# Patient Record
Sex: Male | Born: 1975 | Race: White | Hispanic: No | Marital: Single | Smoking: Current every day smoker
Health system: Southern US, Community
[De-identification: ages and names within clinical notes are randomized; demographics above are authoritative.]

## PROBLEM LIST (undated history)

## (undated) DIAGNOSIS — F19988 Other psychoactive substance use, unspecified with other psychoactive substance-induced disorder: Secondary | ICD-10-CM

## (undated) DIAGNOSIS — F1010 Alcohol abuse, uncomplicated: Secondary | ICD-10-CM

## (undated) DIAGNOSIS — Z720 Tobacco use: Secondary | ICD-10-CM

## (undated) DIAGNOSIS — I10 Essential (primary) hypertension: Secondary | ICD-10-CM

## (undated) DIAGNOSIS — F1410 Cocaine abuse, uncomplicated: Secondary | ICD-10-CM

## (undated) DIAGNOSIS — F09 Unspecified mental disorder due to known physiological condition: Secondary | ICD-10-CM

## (undated) DIAGNOSIS — Z8659 Personal history of other mental and behavioral disorders: Secondary | ICD-10-CM

## (undated) DIAGNOSIS — J439 Emphysema, unspecified: Secondary | ICD-10-CM

## (undated) HISTORY — PX: TONSILLECTOMY: SUR1361

---

## 1999-09-04 ENCOUNTER — Inpatient Hospital Stay (HOSPITAL_COMMUNITY): Admission: AD | Admit: 1999-09-04 | Discharge: 1999-09-07 | Payer: Self-pay

## 2010-05-24 ENCOUNTER — Encounter: Payer: Self-pay | Admitting: Family Medicine

## 2011-12-19 ENCOUNTER — Emergency Department (HOSPITAL_COMMUNITY): Payer: PRIVATE HEALTH INSURANCE

## 2011-12-19 ENCOUNTER — Encounter (HOSPITAL_COMMUNITY): Payer: Self-pay | Admitting: *Deleted

## 2011-12-19 ENCOUNTER — Inpatient Hospital Stay (HOSPITAL_COMMUNITY): Payer: PRIVATE HEALTH INSURANCE

## 2011-12-19 ENCOUNTER — Inpatient Hospital Stay (HOSPITAL_COMMUNITY)
Admission: EM | Admit: 2011-12-19 | Discharge: 2011-12-20 | DRG: 918 | Payer: PRIVATE HEALTH INSURANCE | Attending: Internal Medicine | Admitting: Internal Medicine

## 2011-12-19 DIAGNOSIS — F19988 Other psychoactive substance use, unspecified with other psychoactive substance-induced disorder: Secondary | ICD-10-CM

## 2011-12-19 DIAGNOSIS — T510X1A Toxic effect of ethanol, accidental (unintentional), initial encounter: Secondary | ICD-10-CM | POA: Diagnosis present

## 2011-12-19 DIAGNOSIS — E872 Acidosis, unspecified: Secondary | ICD-10-CM | POA: Diagnosis present

## 2011-12-19 DIAGNOSIS — F29 Unspecified psychosis not due to a substance or known physiological condition: Secondary | ICD-10-CM

## 2011-12-19 DIAGNOSIS — Z720 Tobacco use: Secondary | ICD-10-CM | POA: Diagnosis present

## 2011-12-19 DIAGNOSIS — F09 Unspecified mental disorder due to known physiological condition: Secondary | ICD-10-CM

## 2011-12-19 DIAGNOSIS — F1010 Alcohol abuse, uncomplicated: Secondary | ICD-10-CM | POA: Diagnosis present

## 2011-12-19 DIAGNOSIS — T405X1A Poisoning by cocaine, accidental (unintentional), initial encounter: Principal | ICD-10-CM | POA: Diagnosis present

## 2011-12-19 DIAGNOSIS — I10 Essential (primary) hypertension: Secondary | ICD-10-CM | POA: Diagnosis present

## 2011-12-19 DIAGNOSIS — D72829 Elevated white blood cell count, unspecified: Secondary | ICD-10-CM | POA: Diagnosis present

## 2011-12-19 DIAGNOSIS — T43601A Poisoning by unspecified psychostimulants, accidental (unintentional), initial encounter: Secondary | ICD-10-CM | POA: Diagnosis present

## 2011-12-19 DIAGNOSIS — S0083XA Contusion of other part of head, initial encounter: Secondary | ICD-10-CM | POA: Diagnosis present

## 2011-12-19 DIAGNOSIS — Z9089 Acquired absence of other organs: Secondary | ICD-10-CM

## 2011-12-19 DIAGNOSIS — M542 Cervicalgia: Secondary | ICD-10-CM | POA: Diagnosis present

## 2011-12-19 DIAGNOSIS — F17200 Nicotine dependence, unspecified, uncomplicated: Secondary | ICD-10-CM | POA: Diagnosis present

## 2011-12-19 DIAGNOSIS — R7309 Other abnormal glucose: Secondary | ICD-10-CM | POA: Diagnosis present

## 2011-12-19 DIAGNOSIS — F1910 Other psychoactive substance abuse, uncomplicated: Secondary | ICD-10-CM

## 2011-12-19 DIAGNOSIS — E860 Dehydration: Secondary | ICD-10-CM | POA: Diagnosis present

## 2011-12-19 DIAGNOSIS — R000 Tachycardia, unspecified: Secondary | ICD-10-CM

## 2011-12-19 DIAGNOSIS — S0003XA Contusion of scalp, initial encounter: Secondary | ICD-10-CM | POA: Diagnosis present

## 2011-12-19 DIAGNOSIS — F1999 Other psychoactive substance use, unspecified with unspecified psychoactive substance-induced disorder: Secondary | ICD-10-CM

## 2011-12-19 DIAGNOSIS — R221 Localized swelling, mass and lump, neck: Secondary | ICD-10-CM

## 2011-12-19 DIAGNOSIS — R259 Unspecified abnormal involuntary movements: Secondary | ICD-10-CM | POA: Diagnosis present

## 2011-12-19 DIAGNOSIS — F1410 Cocaine abuse, uncomplicated: Secondary | ICD-10-CM | POA: Diagnosis present

## 2011-12-19 DIAGNOSIS — N390 Urinary tract infection, site not specified: Secondary | ICD-10-CM | POA: Diagnosis present

## 2011-12-19 DIAGNOSIS — I4510 Unspecified right bundle-branch block: Secondary | ICD-10-CM | POA: Diagnosis present

## 2011-12-19 DIAGNOSIS — M6282 Rhabdomyolysis: Secondary | ICD-10-CM | POA: Diagnosis present

## 2011-12-19 DIAGNOSIS — T50904A Poisoning by unspecified drugs, medicaments and biological substances, undetermined, initial encounter: Secondary | ICD-10-CM

## 2011-12-19 DIAGNOSIS — J438 Other emphysema: Secondary | ICD-10-CM | POA: Diagnosis present

## 2011-12-19 DIAGNOSIS — Z23 Encounter for immunization: Secondary | ICD-10-CM

## 2011-12-19 DIAGNOSIS — T510X4A Toxic effect of ethanol, undetermined, initial encounter: Secondary | ICD-10-CM | POA: Diagnosis present

## 2011-12-19 DIAGNOSIS — F19951 Other psychoactive substance use, unspecified with psychoactive substance-induced psychotic disorder with hallucinations: Secondary | ICD-10-CM | POA: Diagnosis present

## 2011-12-19 HISTORY — DX: Unspecified mental disorder due to known physiological condition: F09

## 2011-12-19 HISTORY — DX: Cocaine abuse, uncomplicated: F14.10

## 2011-12-19 HISTORY — DX: Essential (primary) hypertension: I10

## 2011-12-19 HISTORY — DX: Personal history of other mental and behavioral disorders: Z86.59

## 2011-12-19 HISTORY — DX: Other psychoactive substance use, unspecified with other psychoactive substance-induced disorder: F19.988

## 2011-12-19 HISTORY — DX: Tobacco use: Z72.0

## 2011-12-19 HISTORY — DX: Alcohol abuse, uncomplicated: F10.10

## 2011-12-19 HISTORY — DX: Emphysema, unspecified: J43.9

## 2011-12-19 LAB — T4, FREE: Free T4: 1.03 ng/dL (ref 0.80–1.80)

## 2011-12-19 LAB — BASIC METABOLIC PANEL
BUN: 24 mg/dL — ABNORMAL HIGH (ref 6–23)
BUN: 22 mg/dL (ref 6–23)
CO2: 22 mEq/L (ref 19–32)
Calcium: 8.6 mg/dL (ref 8.4–10.5)
Chloride: 105 mEq/L (ref 96–112)
Creatinine, Ser: 1.17 mg/dL (ref 0.50–1.35)
Creatinine, Ser: 1.1 mg/dL (ref 0.50–1.35)
GFR calc Af Amer: 90 mL/min — ABNORMAL LOW (ref >90)
GFR calc non Af Amer: 78 mL/min — ABNORMAL LOW (ref >90)
Glucose, Bld: 80 mg/dL (ref 70–99)
Potassium: 4.2 mEq/L (ref 3.5–5.1)

## 2011-12-19 LAB — HEPATIC FUNCTION PANEL
Albumin: 4.4 g/dL (ref 3.5–5.2)
Alkaline Phosphatase: 83 U/L (ref 39–117)
Bilirubin, Direct: 0.1 mg/dL (ref 0.0–0.3)
Total Bilirubin: 0.5 mg/dL (ref 0.3–1.2)

## 2011-12-19 LAB — CBC WITH DIFFERENTIAL/PLATELET
Eosinophils Absolute: 0.3 10*3/uL (ref 0.0–0.7)
Lymphs Abs: 1.8 10*3/uL (ref 0.7–4.0)
Neutrophils Relative %: 81 % — ABNORMAL HIGH (ref 43–77)
RBC: 5.15 MIL/uL (ref 4.22–5.81)
RDW: 12.7 % (ref 11.5–15.5)

## 2011-12-19 LAB — URINALYSIS, ROUTINE W REFLEX MICROSCOPIC
Glucose, UA: NEGATIVE mg/dL
Ketones, ur: 40 mg/dL — AB
Leukocytes, UA: NEGATIVE
Protein, ur: 30 mg/dL — AB
Urobilinogen, UA: 0.2 mg/dL (ref 0.0–1.0)

## 2011-12-19 LAB — CK: Total CK: 1497 U/L — ABNORMAL HIGH (ref 7–232)

## 2011-12-19 LAB — GLUCOSE, CAPILLARY

## 2011-12-19 LAB — RAPID URINE DRUG SCREEN, HOSP PERFORMED
Amphetamines: NOT DETECTED
Tetrahydrocannabinol: NOT DETECTED

## 2011-12-19 LAB — SALICYLATE LEVEL: Salicylate Lvl: <2 mg/dL — ABNORMAL LOW (ref 2.8–20.0)

## 2011-12-19 LAB — ACETAMINOPHEN LEVEL: Acetaminophen (Tylenol), Serum: <15 ug/mL (ref 10–30)

## 2011-12-19 LAB — HEMOGLOBIN A1C: Mean Plasma Glucose: 117 mg/dL — ABNORMAL HIGH (ref <117)

## 2011-12-19 MED ORDER — SODIUM CHLORIDE 0.9 % IV SOLN
1000.0000 mL | Freq: Once | INTRAVENOUS | Status: AC
  Administered 2011-12-19: 1000 mL via INTRAVENOUS

## 2011-12-19 MED ORDER — CLONIDINE HCL 0.1 MG PO TABS
0.1000 mg | ORAL_TABLET | Freq: Two times a day (BID) | ORAL | Status: DC
  Administered 2011-12-19: 0.1 mg via ORAL
  Filled 2011-12-19: qty 1

## 2011-12-19 MED ORDER — VITAMIN B-1 100 MG PO TABS
100.0000 mg | ORAL_TABLET | Freq: Every day | ORAL | Status: DC
  Administered 2011-12-19 – 2011-12-20 (×2): 100 mg via ORAL
  Filled 2011-12-19 (×2): qty 1

## 2011-12-19 MED ORDER — DEXTROSE 5 % IV SOLN
1.0000 g | Freq: Every day | INTRAVENOUS | Status: DC
  Administered 2011-12-19 – 2011-12-20 (×2): 1 g via INTRAVENOUS
  Filled 2011-12-19 (×2): qty 10

## 2011-12-19 MED ORDER — LORAZEPAM 2 MG/ML IJ SOLN
1.0000 mg | Freq: Four times a day (QID) | INTRAMUSCULAR | Status: DC | PRN
  Administered 2011-12-19 – 2011-12-20 (×2): 1 mg via INTRAVENOUS
  Filled 2011-12-19 (×2): qty 1

## 2011-12-19 MED ORDER — LORAZEPAM 1 MG PO TABS
1.0000 mg | ORAL_TABLET | Freq: Once | ORAL | Status: AC
  Administered 2011-12-19: 1 mg via ORAL
  Filled 2011-12-19: qty 1

## 2011-12-19 MED ORDER — LORAZEPAM 0.5 MG PO TABS: 1.0000 mg | ORAL_TABLET | Freq: Four times a day (QID) | ORAL | Status: DC | PRN

## 2011-12-19 MED ORDER — SODIUM CHLORIDE 0.9 % IV SOLN
1000.0000 mL | INTRAVENOUS | Status: DC
  Administered 2011-12-19 – 2011-12-20 (×2): 1000 mL via INTRAVENOUS

## 2011-12-19 MED ORDER — ADULT MULTIVITAMIN W/MINERALS CH
1.0000 | ORAL_TABLET | Freq: Every day | ORAL | Status: DC
  Administered 2011-12-19 – 2011-12-20 (×2): 1 via ORAL
  Filled 2011-12-19 (×2): qty 1

## 2011-12-19 MED ORDER — ALBUTEROL SULFATE (5 MG/ML) 0.5% IN NEBU: 2.5000 mg | INHALATION_SOLUTION | RESPIRATORY_TRACT | Status: DC | PRN

## 2011-12-19 MED ORDER — THIAMINE HCL 100 MG/ML IJ SOLN: 100.0000 mg | Freq: Every day | INTRAMUSCULAR | Status: DC

## 2011-12-19 MED ORDER — FOLIC ACID 1 MG PO TABS
1.0000 mg | ORAL_TABLET | Freq: Every day | ORAL | Status: DC
  Administered 2011-12-19 – 2011-12-20 (×2): 1 mg via ORAL
  Filled 2011-12-19 (×2): qty 1

## 2011-12-19 MED ORDER — NICOTINE 21 MG/24HR TD PT24
21.0000 mg | MEDICATED_PATCH | Freq: Every day | TRANSDERMAL | Status: DC
  Administered 2011-12-19: 21 mg via TRANSDERMAL
  Filled 2011-12-19 (×2): qty 1

## 2011-12-19 MED ORDER — SODIUM CHLORIDE 0.9 % IV SOLN
1000.0000 mL | INTRAVENOUS | Status: DC
  Administered 2011-12-19: 1000 mL via INTRAVENOUS

## 2011-12-19 MED ORDER — ONDANSETRON HCL 4 MG/2ML IJ SOLN: 4.0000 mg | Freq: Four times a day (QID) | INTRAMUSCULAR | Status: DC | PRN

## 2011-12-19 MED ORDER — ONDANSETRON HCL 4 MG PO TABS: 4.0000 mg | ORAL_TABLET | Freq: Four times a day (QID) | ORAL | Status: DC | PRN

## 2011-12-19 MED ORDER — ALUM & MAG HYDROXIDE-SIMETH 200-200-20 MG/5ML PO SUSP: 30.0000 mL | Freq: Four times a day (QID) | ORAL | Status: DC | PRN

## 2011-12-19 MED ORDER — ACETAMINOPHEN 325 MG PO TABS
650.0000 mg | ORAL_TABLET | Freq: Four times a day (QID) | ORAL | Status: DC | PRN
  Administered 2011-12-19 – 2011-12-20 (×2): 650 mg via ORAL
  Filled 2011-12-19 (×2): qty 2

## 2011-12-19 MED ORDER — ACETAMINOPHEN 650 MG RE SUPP: 650.0000 mg | Freq: Four times a day (QID) | RECTAL | Status: DC | PRN
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
## 2011-12-19 NOTE — ED Provider Notes (Signed)
See prior note   Ward Givens, MD 12/19/11 [phone]
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
## 2011-12-19 NOTE — Plan of Care (Signed)
Problem: Consults Goal: General Medical Patient Education See Patient Education Module for specific education. Outcome: Progressing Confused, Rhabdomyolosis suspected on admission. Monitoring closely Goal: Skin Care Protocol Initiated - if indicated If consults are not indicated, leave blank or document N/A Outcome: Progressing Bug bites over body. States he works outside  Problem: Phase I Progression Outcomes Goal: Pain controlled with appropriate interventions Outcome: Progressing Generalized pain, back of neck pain  Goal: OOB as tolerated unless otherwise ordered Outcome: Progressing Stood at bs standby assist only Goal: Initial discharge plan identified Outcome: Progressing Lives with girlfriend Morrie Sheldon of 10 years Goal: Voiding-avoid urinary catheter unless indicated Outcome: Completed/Met Date Met:  12/19/11 Patient voiding Goal: Hemodynamically stable Outcome: Completed/Met Date Met:  12/19/11 Vitals stable
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
## 2011-12-19 NOTE — ED Notes (Signed)
Report called to Anna, RN in ICU. 
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
## 2011-12-19 NOTE — ED Notes (Signed)
Patient transported to X-ray 

## 2011-12-20 ENCOUNTER — Encounter (HOSPITAL_COMMUNITY): Payer: Self-pay | Admitting: Internal Medicine

## 2011-12-20 DIAGNOSIS — F1010 Alcohol abuse, uncomplicated: Secondary | ICD-10-CM

## 2011-12-20 LAB — BASIC METABOLIC PANEL
CO2: 22 mEq/L (ref 19–32)
Calcium: 8.4 mg/dL (ref 8.4–10.5)
Chloride: 109 mEq/L (ref 96–112)
Potassium: 3.5 mEq/L (ref 3.5–5.1)
Sodium: 139 mEq/L (ref 135–145)

## 2011-12-20 LAB — CK TOTAL AND CKMB (NOT AT ARMC)
CK, MB: 13.4 ng/mL (ref 0.3–4.0)
Relative Index: 1 (ref 0.0–2.5)
Total CK: 1347 U/L — ABNORMAL HIGH (ref 7–232)

## 2011-12-20 LAB — CBC
HCT: 39.4 % (ref 39.0–52.0)
MCV: 88.3 fL (ref 78.0–100.0)
Platelets: 275 10*3/uL (ref 150–400)
RBC: 4.46 MIL/uL (ref 4.22–5.81)
WBC: 11.2 10*3/uL — ABNORMAL HIGH (ref 4.0–10.5)

## 2011-12-20 LAB — HEPATIC FUNCTION PANEL: Total Protein: 5.9 g/dL — ABNORMAL LOW (ref 6.0–8.3)

## 2011-12-20 MED ORDER — CLONIDINE HCL 0.1 MG PO TABS: 0.1000 mg | ORAL_TABLET | Freq: Two times a day (BID) | ORAL | Status: DC

## 2011-12-20 MED ORDER — CEFUROXIME AXETIL 500 MG PO TABS: 500.0000 mg | ORAL_TABLET | Freq: Two times a day (BID) | ORAL | Status: DC

## 2011-12-20 MED ORDER — POTASSIUM CHLORIDE CRYS ER 20 MEQ PO TBCR
20.0000 meq | EXTENDED_RELEASE_TABLET | Freq: Two times a day (BID) | ORAL | Status: DC
  Administered 2011-12-20: 20 meq via ORAL
  Filled 2011-12-20: qty 1

## 2011-12-20 MED ORDER — ADULT MULTIVITAMIN W/MINERALS CH: 1.0000 | ORAL_TABLET | Freq: Every day | ORAL | Status: DC

## 2011-12-20 MED ORDER — CLONIDINE HCL 0.1 MG PO TABS: 0.1000 mg | ORAL_TABLET | Freq: Every day | ORAL | Status: DC

## 2011-12-20 MED ORDER — CLONIDINE HCL 0.1 MG PO TABS
0.1000 mg | ORAL_TABLET | Freq: Every day | ORAL | Status: DC
  Filled 2011-12-20: qty 1

## 2011-12-20 MED ORDER — NICOTINE 21 MG/24HR TD PT24: 1.0000 | MEDICATED_PATCH | Freq: Every day | TRANSDERMAL | Status: AC

## 2011-12-20 MED ORDER — CEFUROXIME AXETIL 500 MG PO TABS: 500.0000 mg | ORAL_TABLET | Freq: Two times a day (BID) | ORAL | Status: AC
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
## 2011-12-20 NOTE — Clinical Social Work Note (Signed)
CSW received consult and chart reviewed. ACT team has assessed pt and recommended inpatient treatment. According to ACT note, pt has been referred to several facilities. ACT team to follow. CSW will sign off but can be reconsulted if needs arise.  Phillip Schultz, [place] [phone]

## 2011-12-20 NOTE — Progress Notes (Signed)
CRITICAL VALUE ALERT  Critical value received:  MB of 13.4  Date of notification:  12/20/11  Time of notification:  0615  Critical value read back: yes  Nurse who received alert:  Rockwell Germany RN  MD notified (1st page):  Dr. Vania Rea via text page  Time of first page:  [phone]  MD notified (2nd page):  Time of second page:  Responding MD:  Dr. Vania Rea  Time MD responded:  [phone]

## 2011-12-20 NOTE — BH Assessment (Signed)
Assessment Note   Phillip Schultz is an 36 y.o. male. After admission through the ED, the patient has been in the [facility].  His psychosis has resolved. The patient denies nay hallucinations this morning. He is not delusional. He feels the problems were from being overworked and exhausted. He also thinks there may have been something else in the Cocaine that he did. He denies any past history of hallucinations. He denies any SI. He has made only 1  Attempt on his life and that was 15 years ago. He does admit to an overdose, he states he was trying to get his wife to return to the marriage then. He denies depression. He is not interested in inpatient or out patient treatment. His girlfriend supports his statements and agrees that he does not need any treatment at this time.  Discussed with Dr Jimmy Footman I:  Cocaine Abuse; Alcohol Abuse; Substance induced psychotic disorder now resolved Axis II: Deferred Axis III:  Past Medical History  Diagnosis Date  . Hypertension   . History of depression     Attempted suicide as a teenager.  . Cocaine abuse   . Tobacco abuse   . Alcohol consumption binge drinking   . 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 Emphysema     Seen radiographically.   Axis IV: occupational problems Axis V: 51-60 moderate symptoms  Past Medical History:  Past Medical History  Diagnosis Date  . Hypertension   . History of depression     Attempted suicide as a teenager.  . Cocaine abuse   . Tobacco abuse   . Alcohol consumption binge drinking   . 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 Emphysema     Seen radiographically.    Past Surgical History  Procedure Date  . Tonsillectomy     Family History: No family history on file.  Social History:  reports that he has been smoking Cigarettes.  He does not have any smokeless tobacco history on file. He reports that he drinks alcohol. He reports that he uses illicit drugs  (Cocaine and Methamphetamines).  Additional Social History:  Alcohol / Drug Use History of alcohol / drug use?: Yes Longest period of sobriety (when/how long): 1 MONTH Substance #1 Name of Substance 1: ETOH 1 - Age of First Use: 11 1 - Amount (size/oz): 1 PINT 1 - Frequency:  2 X WEEKLY 1 - Duration: ON GOING 1 - Last Use / Amount: 12/18/11  CIWA: CIWA-Ar BP: 101/71 mmHg Pulse Rate: 106  Nausea and Vomiting: no nausea and no vomiting Tactile Disturbances: none Tremor: three Auditory Disturbances: not present Paroxysmal Sweats: no sweat visible Visual Disturbances: not present Anxiety: three Headache, Fullness in Head: none present Agitation: somewhat more than normal activity Orientation and Clouding of Sensorium: oriented and can do serial additions CIWA-Ar Total: 7  COWS:    Allergies: No Known Allergies  Home Medications:  No prescriptions prior to admission    OB/GYN Status:  No LMP for male patient.  General Assessment Data Location of Assessment: [facility] ([facility]) ACT Assessment: Yes Living Arrangements: Spouse/significant other Can pt return to current living arrangement?: Yes Admission Status: Voluntary Is patient capable of signing voluntary admission?: Yes Transfer from: Acute Hospital Referral Source: Medical Floor Inpatient  Education Status Is patient currently in school?: No  Risk to self Suicidal Ideation: No Suicidal Intent: No Is patient at risk for suicide?: No Suicidal Plan?: No Access to Means: No What  has been your use of drugs/alcohol within the last 12 months?: etoh;cocaine Previous Attempts/Gestures: Yes How many times?: 1  (overdose 15 years ago when wife left, attentions seeking to ) Other Self Harm Risks: self medicationing Triggers for Past Attempts: Other (Comment) (took overdose; attentions seeking; trying to getr wife to r;) Intentional Self Injurious Behavior: None Family Suicide History: Yes Recent stressful life event(s):  Other (Comment) (working long hours at work) Persecutory voices/beliefs?: No Depression: Yes Depression Symptoms: Fatigue;Loss of interest in usual pleasures (feling overworked;no time for him or family) Substance abuse history and/or treatment for substance abuse?: Yes Suicide prevention information given to non-admitted patients: Yes  Risk to Others Homicidal Ideation: No Thoughts of Harm to Others: No Current Homicidal Intent: No Current Homicidal Plan: No Access to Homicidal Means: No Identified Victim: na History of harm to others?: No Assessment of Violence: None Noted Violent Behavior Description: none Does patient have access to weapons?: No Criminal Charges Pending?: No Does patient have a court date: No  Psychosis Hallucinations: None noted Delusions: None noted  Mental Status Report Appear/Hygiene: Improved Eye Contact: Good Motor Activity: Restlessness Speech: Logical/coherent Level of Consciousness: Alert;Restless Mood: Anxious Affect: Anxious;Appropriate to circumstance Anxiety Level: Moderate Thought Processes: Coherent;Relevant Judgement: Unimpaired Orientation: Person;Place;Time;Situation Obsessive Compulsive Thoughts/Behaviors: None  Cognitive Functioning Concentration: Normal Memory: Recent Intact;Remote Intact IQ: Average Insight: Good Impulse Control: Fair Appetite: Good Weight Loss: 0  Weight Gain: 0  Sleep: Decreased Total Hours of Sleep: 12  Vegetative Symptoms: None  ADLScreening [facility] Assessment Services) Patient's cognitive ability adequate to safely complete daily activities?: Yes Patient able to express need for assistance with ADLs?: Yes Independently performs ADLs?: Yes (appropriate for developmental age)  Abuse/Neglect [facility]) Physical Abuse: Denies Verbal Abuse: Denies Sexual Abuse: Denies  Prior Inpatient Therapy Prior Inpatient Therapy: Yes Prior Therapy Dates: 44 YRS AGO Prior Therapy Facilty/Provider(s): [facility] Reason  for Treatment: STABILIZATION  Prior Outpatient Therapy Prior Outpatient Therapy: No Prior Therapy Dates: NA Prior Therapy Facilty/Provider(s): NA Reason for Treatment: NA  ADL Screening (condition at time of admission) Patient's cognitive ability adequate to safely complete daily activities?: Yes Patient able to express need for assistance with ADLs?: Yes Independently performs ADLs?: Yes (appropriate for developmental age) Weakness of Legs: None Weakness of Arms/Hands: None  Home Assistive Devices/Equipment Home Assistive Devices/Equipment: None  Therapy Consults (therapy consults require a physician order) PT Evaluation Needed: No OT Evalulation Needed: No SLP Evaluation Needed: No Abuse/Neglect Assessment (Assessment to be complete while patient is alone) Physical Abuse: Denies Verbal Abuse: Denies Sexual Abuse: Denies Exploitation of patient/patient's resources: Denies Self-Neglect: Denies Values / Beliefs Cultural Requests During Hospitalization: None Spiritual Requests During Hospitalization: None Consults Spiritual Care Consult Needed: No Social Work Consult Needed: No [employer]chant [employer] officer (For Healthcare) Advance Directive: Patient does not have advance directive Pre-existing out of facility DNR order (yellow form or pink MOST form): No Nutrition Screen Diet: Regular Unintentional weight loss greater than 10lbs within the last month: No Problems chewing or swallowing foods and/or liquids: No Home Tube Feeding or Total Parenteral Nutrition (TPN): No Patient appears severely malnourished: No  Additional Information 1:1 In Past 12 Months?: No CIRT Risk: No Elopement Risk: No Does patient have medical clearance?: Yes     Disposition:PATIENT DOES NOT MEET CRITERIA FOR INPATIENT CARE AT THIS TIME. PATIENT DECLINED REFERRAL TO OUT PATIENT SERVICES. PATIENT WILL FOLLOW UP WITH HIS PCP. PATIENT WILL ALSO DISCUSS HIS WORK ISSUES WITH HR AT HIS PLACE OF EMPLOYMENT. DR  [name] AGREES WITH THIS DISPOSITION  AND WILL DISCHARGE THE PATIENT.  Disposition Disposition of Patient: Other dispositions Type of inpatient treatment program: Adult Other disposition(s): Other (Comment) (to be seen by pcp; contact HR at work) Patient referred to: Other (Comment) (to see PCP; discuss work stress with HR)  On Site Evaluation by:   Reviewed with Physician:     Jearld Pies 12/20/2011 10:29 AM
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
## 2011-12-20 NOTE — Progress Notes (Signed)
Pt alert and oriented. Girlfriend, of 10 years, visiting in room. Pt, and family updated on plan of care. Tommy, from ACT team, notified by phone that pt has been medically cleared.

## 2011-12-20 NOTE — Progress Notes (Signed)
UR Chart Review Completed  
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
## 2011-12-20 NOTE — Progress Notes (Signed)
Pt discharged to home with girlfriend. D/c instructions given regarding follow up appointment, medications, activity, and diet.  IV d/c site clean, dry, and intact. Pt strongly discouraged from use of recreation drugs, and teaching of risks given. Pt nodded head in affirmation, but would not "teach back.'

## 2011-12-21 LAB — URINE CULTURE: Colony Count: NO GROWTH

## 2012-08-25 ENCOUNTER — Ambulatory Visit (INDEPENDENT_AMBULATORY_CARE_PROVIDER_SITE_OTHER): Payer: PRIVATE HEALTH INSURANCE | Admitting: Family Medicine

## 2012-08-25 ENCOUNTER — Encounter: Payer: Self-pay | Admitting: Family Medicine

## 2012-08-25 VITALS — BP 131/89 | HR 96 | Temp 97.5°F | Ht 71.5 in | Wt 230.8 lb

## 2012-08-25 DIAGNOSIS — Z720 Tobacco use: Secondary | ICD-10-CM | POA: Insufficient documentation

## 2012-08-25 DIAGNOSIS — E785 Hyperlipidemia, unspecified: Secondary | ICD-10-CM

## 2012-08-25 DIAGNOSIS — Z209 Contact with and (suspected) exposure to unspecified communicable disease: Secondary | ICD-10-CM

## 2012-08-25 DIAGNOSIS — A5903 Trichomonal cystitis and urethritis: Secondary | ICD-10-CM | POA: Insufficient documentation

## 2012-08-25 DIAGNOSIS — E8881 Metabolic syndrome: Secondary | ICD-10-CM

## 2012-08-25 DIAGNOSIS — F17200 Nicotine dependence, unspecified, uncomplicated: Secondary | ICD-10-CM

## 2012-08-25 DIAGNOSIS — I10 Essential (primary) hypertension: Secondary | ICD-10-CM

## 2012-08-25 LAB — HEPATIC FUNCTION PANEL
ALT: 31 U/L (ref 0–53)
AST: 21 U/L (ref 0–37)
Albumin: 4.4 g/dL (ref 3.5–5.2)
Alkaline Phosphatase: 75 U/L (ref 39–117)
Bilirubin, Direct: <0.1 mg/dL (ref 0.0–0.3)
Total Bilirubin: 0.4 mg/dL (ref 0.3–1.2)
Total Protein: 7.4 g/dL (ref 6.0–8.3)

## 2012-08-25 LAB — POCT URINALYSIS DIPSTICK
Bilirubin, UA: NEGATIVE
Glucose, UA: NEGATIVE
Ketones, UA: NEGATIVE
Leukocytes, UA: NEGATIVE
Nitrite, UA: NEGATIVE
Protein, UA: NEGATIVE
Spec Grav, UA: 1.02
Urobilinogen, UA: NEGATIVE
pH, UA: 5

## 2012-08-25 LAB — POCT UA - MICROSCOPIC ONLY
Casts, Ur, LPF, POC: NEGATIVE
Crystals, Ur, HPF, POC: NEGATIVE
Yeast, UA: NEGATIVE

## 2012-08-25 LAB — HEPATITIS C ANTIBODY: HCV Ab: NEGATIVE

## 2012-08-25 LAB — HEPATITIS B SURFACE ANTIGEN: Hepatitis B Surface Ag: NEGATIVE

## 2012-08-25 LAB — HIV ANTIBODY (ROUTINE TESTING W REFLEX): HIV: NONREACTIVE

## 2012-08-25 LAB — RPR

## 2012-08-25 MED ORDER — METRONIDAZOLE 500 MG PO TABS
2000.0000 mg | ORAL_TABLET | Freq: Once | ORAL | Status: DC
Start: 2012-08-25 — End: 2015-04-18

## 2012-08-25 NOTE — Progress Notes (Signed)
Patient ID: Phillip Schultz, male   DOB: Sep 14, 1975, 37 y.o.   MRN: [id number] SUBJECTIVE: HPI: Girlfriend of 6 months has trichomonas infection. He came to get checked for STDs.  PMH/PSH: reviewed/updated in Epic  SH/FH: reviewed/updated in Epic  Allergies: reviewed/updated in Epic  Medications: reviewed/updated in Epic  Immunizations: reviewed/updated in Epic  ROS: As above in the HPI. All other systems are stable or negative.  OBJECTIVE: APPEARANCE: WM NAD  Patient in no acute distress.The patient appeared well nourished and normally developed. Acyanotic. Waist: VITAL SIGNS:BP 131/89  Pulse 96  Temp(Src) 97.5 F (36.4 C) (Oral)  Ht 5' 11.5" (1.816 m)  Wt 230 lb 12.8 oz (104.69 kg)  BMI 31.74 kg/m2   SKIN: warm and  Dry without overt rashes, tattoos and scars  HEAD and Neck: without JVD, Head and scalp: normal Eyes:No scleral icterus. Fundi normal, eye movements normal. Ears: Auricle normal, canal normal, Tympanic membranes normal, insufflation normal. Nose: normal Throat: normal Neck & thyroid: normal  CHEST & LUNGS: Chest wall: normal Lungs: Clear  CVS: Reveals the PMI to be normally located. Regular rhythm, First and Second Heart sounds are normal,  absence of murmurs, rubs or gallops. Peripheral vasculature: Radial pulses: normal Dorsal pedis pulses: normal Posterior pulses: normal  ABDOMEN:  Appearance: normal Benign,, no organomegaly, no masses, no Abdominal Aortic enlargement. No Guarding , no rebound. No Bruits. Bowel sounds: normal   GU: normal  EXTREMETIES: nonedematous. Both Femoral and Pedal pulses are normal.  MUSCULOSKELETAL:  Spine: normal Joints: intact  ASSESSMENT: Urethritis, trichomonal - Plan: POCT UA - Microscopic Only, POCT urinalysis dipstick, Urine culture, GC/chlamydia probe amp, urine, HIV antibody, RPR, Hepatitis B surface antigen, Hepatitis C antibody, HSV(herpes simplex vrs) 1+2 ab-IgG, metroNIDAZOLE (FLAGYL) 500  MG tablet  Tobacco user  Metabolic syndrome - Plan: Hepatic function panel  HTN (hypertension) - Plan: BASIC METABOLIC PANEL WITH GFR  HLD (hyperlipidemia) - Plan: NMR Lipoprofile with Lipids, Hepatic function panel  Communicable disease contact - Plan: POCT UA - Microscopic Only, POCT urinalysis dipstick, Urine culture, GC/chlamydia probe amp, urine, HIV antibody, RPR, Hepatitis B surface antigen, Hepatitis C antibody, HSV(herpes simplex vrs) 1+2 ab-IgG  PLAN:      Dr Phillip Schultz Recommendations  Diet and Exercise discussed with patient.  For nutrition information, I recommend books:  1).Eat to Live by Dr Phillip Schultz. 2).Prevent and Reverse Heart Disease by Dr Phillip Schultz.  Exercise recommendations are:  If unable to walk, then the patient can exercise in a chair 3 times a day. By flapping arms like a bird gently and raising legs outwards to the front.  If ambulatory, the patient can go for walks for 30 minutes 3 times a week. Then increase the intensity and duration as tolerated.  Goal is to try to attain exercise frequency to 5 times a week.  If applicable: Best to perform resistance exercises (machines or weights) 2 days a week and cardio type exercises 3 days per week.  Orders Placed This Encounter  Procedures  . Urine culture  . GC/chlamydia probe amp, urine  . HIV antibody  . RPR  . Hepatitis B surface antigen  . Hepatitis C antibody  . HSV(herpes simplex vrs) 1+2 ab-IgG  . NMR Lipoprofile with Lipids  . BASIC METABOLIC PANEL WITH GFR  . Hepatic function panel  . POCT UA - Microscopic Only  . POCT urinalysis dipstick   Results for orders placed in visit on 08/25/12 (from the past 24 hour(s))  POCT URINALYSIS DIPSTICK  Status: None   Collection Time    08/25/12  2:23 PM      Result Value Range   Color, UA yellow     Clarity, UA clear     Glucose, UA neg     Bilirubin, UA neg     Ketones, UA neg     Spec Grav, UA 1.020     Blood, UA large      pH, UA 5.0     Protein, UA neg     Urobilinogen, UA negative     Nitrite, UA neg     Leukocytes, UA Negative    POCT UA - MICROSCOPIC ONLY     Status: None   Collection Time    08/25/12  2:46 PM      Result Value Range   WBC, Ur, HPF, POC 10-15     RBC, urine, microscopic 5-20     Bacteria, U Microscopic mod     Mucus, UA trace     Epithelial cells, urine per micros mod     Crystals, Ur, HPF, POC neg     Casts, Ur, LPF, POC neg     Yeast, UA neg     Meds ordered this encounter  Medications  . metroNIDAZOLE (FLAGYL) 500 MG tablet    Sig: Take 4 tablets (2,000 mg total) by mouth once.    Dispense:  4 tablet    Refill:  0  RTC pending labs. Hand outs on STDs given  Phillip Schultz P. Modesto Charon, M.D.

## 2012-08-25 NOTE — Patient Instructions (Addendum)
Smoking Cessation Quitting smoking is important to your health and has many advantages. However, it is not always easy to quit since nicotine is a very addictive drug. Often times, people try 3 times or more before being able to quit. This document explains the best ways for you to prepare to quit smoking. Quitting takes hard work and a lot of effort, but you can do it. ADVANTAGES OF QUITTING SMOKING  You will live longer, feel better, and live better.  Your body will feel the impact of quitting smoking almost immediately.  Within 20 minutes, blood pressure decreases. Your pulse returns to its normal level.  After 8 hours, carbon monoxide levels in the blood return to normal. Your oxygen level increases.  After 24 hours, the chance of having a heart attack starts to decrease. Your breath, hair, and body stop smelling like smoke.  After 48 hours, damaged nerve endings begin to recover. Your sense of taste and smell improve.  After 72 hours, the body is virtually free of nicotine. Your bronchial tubes relax and breathing becomes easier.  After 2 to 12 weeks, lungs can hold more air. Exercise becomes easier and circulation improves.  The risk of having a heart attack, stroke, cancer, or lung disease is greatly reduced.  After 1 year, the risk of coronary heart disease is cut in half.  After 5 years, the risk of stroke falls to the same as a nonsmoker.  After 10 years, the risk of lung cancer is cut in half and the risk of other cancers decreases significantly.  After 15 years, the risk of coronary heart disease drops, usually to the level of a nonsmoker.  If you are pregnant, quitting smoking will improve your chances of having a healthy baby.  The people you live with, especially any children, will be healthier.  You will have extra money to spend on things other than cigarettes. QUESTIONS TO THINK ABOUT BEFORE ATTEMPTING TO QUIT You may want to talk about your answers with your  caregiver.  Why do you want to quit?  If you tried to quit in the past, what helped and what did not?  What will be the most difficult situations for you after you quit? How will you plan to handle them?  Who can help you through the tough times? Your family? Friends? A caregiver?  What pleasures do you get from smoking? What ways can you still get pleasure if you quit? Here are some questions to ask your caregiver:  How can you help me to be successful at quitting?  What medicine do you think would be best for me and how should I take it?  What should I do if I need more help?  What is smoking withdrawal like? How can I get information on withdrawal? GET READY  Set a quit date.  Change your environment by getting rid of all cigarettes, ashtrays, matches, and lighters in your home, car, or work. Do not let people smoke in your home.  Review your past attempts to quit. Think about what worked and what did not. GET SUPPORT AND ENCOURAGEMENT You have a better chance of being successful if you have help. You can get support in many ways.  Tell your family, friends, and co-workers that you are going to quit and need their support. Ask them not to smoke around you.  Get individual, group, or telephone counseling and support. Programs are available at local hospitals and health centers. Call your local health department for   information about programs in your area.  Spiritual beliefs and practices may help some smokers quit.  Download a "quit meter" on your computer to keep track of quit statistics, such as how long you have gone without smoking, cigarettes not smoked, and money saved.  Get a self-help book about quitting smoking and staying off of tobacco. LEARN NEW SKILLS AND BEHAVIORS  Distract yourself from urges to smoke. Talk to someone, go for a walk, or occupy your time with a task.  Change your normal routine. Take a different route to work. Drink tea instead of coffee.  Eat breakfast in a different place.  Reduce your stress. Take a hot bath, exercise, or read a book.  Plan something enjoyable to do every day. Reward yourself for not smoking.  Explore interactive web-based programs that specialize in helping you quit. GET MEDICINE AND USE IT CORRECTLY Medicines can help you stop smoking and decrease the urge to smoke. Combining medicine with the above behavioral methods and support can greatly increase your chances of successfully quitting smoking.  Nicotine replacement therapy helps deliver nicotine to your body without the negative effects and risks of smoking. Nicotine replacement therapy includes nicotine gum, lozenges, inhalers, nasal sprays, and skin patches. Some may be available over-the-counter and others require a prescription.  Antidepressant medicine helps people abstain from smoking, but how this works is unknown. This medicine is available by prescription.  Nicotinic receptor partial agonist medicine simulates the effect of nicotine in your brain. This medicine is available by prescription. Ask your caregiver for advice about which medicines to use and how to use them based on your health history. Your caregiver will tell you what side effects to look out for if you choose to be on a medicine or therapy. Carefully read the information on the package. Do not use any other product containing nicotine while using a nicotine replacement product.  RELAPSE OR DIFFICULT SITUATIONS Most relapses occur within the first 3 months after quitting. Do not be discouraged if you start smoking again. Remember, most people try several times before finally quitting. You may have symptoms of withdrawal because your body is used to nicotine. You may crave cigarettes, be irritable, feel very hungry, cough often, get headaches, or have difficulty concentrating. The withdrawal symptoms are only temporary. They are strongest when you first quit, but they will go away within  10 14 days. To reduce the chances of relapse, try to:  Avoid drinking alcohol. Drinking lowers your chances of successfully quitting.  Reduce the amount of caffeine you consume. Once you quit smoking, the amount of caffeine in your body increases and can give you symptoms, such as a rapid heartbeat, sweating, and anxiety.  Avoid smokers because they can make you want to smoke.  Do not let weight gain distract you. Many smokers will gain weight when they quit, usually less than 10 pounds. Eat a healthy diet and stay active. You can always lose the weight gained after you quit.  Find ways to improve your mood other than smoking. FOR MORE INFORMATION  www.smokefree.gov  Document Released: 04/13/2001 Document Revised: 10/19/2011 Document Reviewed: 07/29/2011 [facility] Patient Information 2013 [place], [place].        Dr Woodroe Mode Recommendations  Diet and Exercise discussed with patient.  For nutrition information, I recommend books:  1).Eat to Live by Dr Monico Hoar. 2).Prevent and Reverse Heart Disease by Dr Suzzette Righter.  Exercise recommendations are:  If unable to walk, then the patient can exercise in a  chair 3 times a day. By flapping arms like a bird gently and raising legs outwards to the front.  If ambulatory, the patient can go for walks for 30 minutes 3 times a week. Then increase the intensity and duration as tolerated.  Goal is to try to attain exercise frequency to 5 times a week.  If applicable: Best to perform resistance exercises (machines or weights) 2 days a week and cardio type exercises 3 days per week.   Trichomoniasis Trichomoniasis is an infection, caused by the Trichomonas organism, that affects both women and men. In women, the outer male genitalia and the vagina are affected. In men, the penis is mainly affected, but the prostate and other reproductive organs can also be involved. Trichomoniasis is a sexually transmitted disease (STD) and is  most often passed to another person through sexual contact. The majority of people who get trichomoniasis do so from a sexual encounter and are also at risk for other STDs. CAUSES   Sexual intercourse with an infected partner.  It can be present in swimming pools or hot tubs. SYMPTOMS   Abnormal gray-green frothy vaginal discharge in women.  Vaginal itching and irritation in women.  Itching and irritation of the area outside the vagina in women.  Penile discharge with or without pain in males.  Inflammation of the urethra (urethritis), causing painful urination.  Bleeding after sexual intercourse. RELATED COMPLICATIONS  Pelvic inflammatory disease.  Infection of the uterus (endometritis).  Infertility.  Tubal (ectopic) pregnancy.  It can be associated with other STDs, including gonorrhea and chlamydia, hepatitis B, and HIV. COMPLICATIONS DURING PREGNANCY  Early (premature) delivery.  Premature rupture of the membranes (PROM).  Low birth weight. DIAGNOSIS   Visualization of Trichomonas under the microscope from the vagina discharge.  Ph of the vagina greater than 4.5, tested with a test tape.  Trich Rapid Test.  Culture of the organism, but this is not usually needed.  It may be found on a Pap test.  Having a "strawberry cervix,"which means the cervix looks very red like a strawberry. TREATMENT   You may be given medication to fight the infection. Inform your caregiver if you could be or are pregnant. Some medications used to treat the infection should not be taken during pregnancy.  Over-the-counter medications or creams to decrease itching or irritation may be recommended.  Your sexual partner will need to be treated if infected. HOME CARE INSTRUCTIONS   Take all medication prescribed by your caregiver.  Take over-the-counter medication for itching or irritation as directed by your caregiver.  Do not have sexual intercourse while you have the  infection.  Do not douche or wear tampons.  Discuss your infection with your partner, as your partner may have acquired the infection from you. Or, your partner may have been the person who transmitted the infection to you.  Have your sex partner examined and treated if necessary.  Practice safe, informed, and protected sex.  See your caregiver for other STD testing. SEEK MEDICAL CARE IF:   You still have symptoms after you finish the medication.  You have an oral temperature above 102 F (38.9 C).  You develop belly (abdominal) pain.  You have pain when you urinate.  You have bleeding after sexual intercourse.  You develop a rash.  The medication makes you sick or makes you throw up (vomit). Document Released: 10/13/2000 Document Revised: 07/12/2011 Document Reviewed: 11/08/2008 [facility] Patient Information 2013 [place], [place].   Sexually Transmitted Disease Sexually transmitted disease (  STD) refers to any infection that is passed from person to person during sexual activity. This may happen by way of saliva, semen, blood, vaginal mucus, or urine. Common STDs include:  Gonorrhea.  Chlamydia.  Syphilis.  HIV/AIDS.  Genital herpes.  Hepatitis B and C.  Trichomonas.  Human papillomavirus (HPV).  Pubic lice. CAUSES  An STD may be spread by bacteria, virus, or parasite. A person can get an STD by:  Sexual intercourse with an infected person.  Sharing sex toys with an infected person.  Sharing needles with an infected person.  Having intimate contact with the genitals, mouth, or rectal areas of an infected person. SYMPTOMS  Some people may not have any symptoms, but they can still pass the infection to others. Different STDs have different symptoms. Symptoms include:  Painful or bloody urination.  Pain in the pelvis, abdomen, vagina, anus, throat, or eyes.  Skin rash, itching, irritation, growths, or sores (lesions). These usually occur in the genital  or anal area.  Abnormal vaginal discharge.  Penile discharge in men.  Soft, flesh-colored skin growths in the genital or anal area.  Fever.  Pain or bleeding during sexual intercourse.  Swollen glands in the groin area.  Yellow skin and eyes (jaundice). This is seen with hepatitis. DIAGNOSIS  To make a diagnosis, your caregiver may:  Take a medical history.  Perform a physical exam.  Take a specimen (culture) to be examined.  Examine a sample of discharge under a microscope.  Perform blood tests.  Perform a Pap test, if this applies.  Perform a colposcopy.  Perform a laparoscopy. TREATMENT   Chlamydia, gonorrhea, trichomonas, and syphilis can be cured with antibiotic medicine.  Genital herpes, hepatitis, and HIV can be treated, but not cured, with prescribed medicines. The medicines will lessen the symptoms.  Genital warts from HPV can be treated with medicine or by freezing, burning (electrocautery), or surgery. Warts may come back.  HPV is a virus and cannot be cured with medicine or surgery.However, abnormal areas may be followed very closely by your caregiver and may be removed from the cervix, vagina, or vulva through office procedures or surgery. If your diagnosis is confirmed, your recent sexual partners need treatment. This is true even if they are symptom-free or have a negative culture or evaluation. They should not have sex until their caregiver says it is okay. HOME CARE INSTRUCTIONS  All sexual partners should be informed, tested, and treated for all STDs.  Take your antibiotics as directed. Finish them even if you start to feel better.  Only take over-the-counter or prescription medicines for pain, discomfort, or fever as directed by your caregiver.  Rest.  Eat a balanced diet and drink enough fluids to keep your urine clear or pale yellow.  Do not have sex until treatment is completed and you have followed up with your caregiver. STDs should be  checked after treatment.  Keep all follow-up appointments, Pap tests, and blood tests as directed by your caregiver.  Only use latex condoms and water-soluble lubricants during sexual activity. Do not use petroleum jelly or oils.  Avoid alcohol and illegal drugs.  Get vaccinated for HPV and hepatitis. If you have not received these vaccines in the past, talk to your caregiver about whether one or both might be right for you.  Avoid risky sex practices that can break the skin. The only way to avoid getting an STD is to avoid all sexual activity.Latex condoms and dental dams (for oral sex) will  help lessen the risk of getting an STD, but will not completely eliminate the risk. SEEK MEDICAL CARE IF:   You have a fever.  You have any new or worsening symptoms. Document Released: 07/10/2002 Document Revised: 07/12/2011 Document Reviewed: 07/17/2010 [facility] Patient Information 2013 [place], [place], [place].

## 2012-08-26 LAB — BASIC METABOLIC PANEL WITH GFR
BUN: 18 mg/dL (ref 6–23)
CO2: 24 mEq/L (ref 19–32)
Calcium: 9.5 mg/dL (ref 8.4–10.5)
Chloride: 102 mEq/L (ref 96–112)
Creat: 0.87 mg/dL (ref 0.50–1.35)
GFR, Est African American: >89 mL/min
GFR, Est Non African American: >89 mL/min
Glucose, Bld: 99 mg/dL (ref 70–99)
Potassium: 3.8 mEq/L (ref 3.5–5.3)
Sodium: 136 mEq/L (ref 135–145)

## 2012-08-26 LAB — URINE CULTURE
Colony Count: NO GROWTH
Organism ID, Bacteria: NO GROWTH

## 2012-08-28 LAB — NMR LIPOPROFILE WITH LIPIDS
Cholesterol, Total: 214 mg/dL — ABNORMAL HIGH (ref <200)
HDL Particle Number: 25.1 umol/L — ABNORMAL LOW (ref >=30.5)
HDL Size: 8.9 nm — ABNORMAL LOW (ref >=9.2)
HDL-C: 32 mg/dL — ABNORMAL LOW (ref >=40)
LDL (calc): 48 mg/dL (ref <100)
LDL Particle Number: 2042 nmol/L — ABNORMAL HIGH (ref <1000)
LDL Size: 19.5 nm — ABNORMAL LOW (ref >20.5)
LP-IR Score: 80 — ABNORMAL HIGH (ref <=45)
Large HDL-P: <1.3 umol/L — ABNORMAL LOW (ref >=4.8)
Large VLDL-P: 31.2 nmol/L — ABNORMAL HIGH (ref <=2.7)
Small LDL Particle Number: 1819 nmol/L — ABNORMAL HIGH (ref <=527)
Triglycerides: 668 mg/dL — ABNORMAL HIGH (ref <150)
VLDL Size: 57.3 nm — ABNORMAL HIGH (ref <=46.6)

## 2012-08-28 LAB — HSV(HERPES SIMPLEX VRS) I + II AB-IGG
HSV 1 Glycoprotein G Ab, IgG: 4.69 IV — ABNORMAL HIGH
HSV 2 Glycoprotein G Ab, IgG: <0.1 IV

## 2012-08-30 LAB — GC/CHLAMYDIA PROBE AMP, URINE
Chlamydia, Swab/Urine, PCR: NEGATIVE
GC Probe Amp, Urine: NEGATIVE

## 2012-09-04 ENCOUNTER — Encounter: Payer: Self-pay | Admitting: Nurse Practitioner

## 2012-09-04 NOTE — Progress Notes (Signed)
This encounter was created in error - please disregard.

## 2012-10-14 ENCOUNTER — Ambulatory Visit (INDEPENDENT_AMBULATORY_CARE_PROVIDER_SITE_OTHER): Payer: Self-pay | Admitting: General Practice

## 2012-10-14 VITALS — BP 145/95 | HR 100 | Temp 98.1°F | Ht 72.0 in | Wt 232.0 lb

## 2012-10-14 DIAGNOSIS — J069 Acute upper respiratory infection, unspecified: Secondary | ICD-10-CM

## 2012-10-14 MED ORDER — CLONIDINE HCL 0.1 MG PO TABS: 0.1000 mg | ORAL_TABLET | Freq: Every day | ORAL | Status: AC

## 2012-10-14 MED ORDER — CLONIDINE HCL 0.1 MG PO TABS: 0.1000 mg | ORAL_TABLET | Freq: Every day | ORAL | Status: DC

## 2012-10-14 MED ORDER — AMOXICILLIN 500 MG PO CAPS: 500.0000 mg | ORAL_CAPSULE | Freq: Two times a day (BID) | ORAL | Status: AC

## 2012-10-14 NOTE — Patient Instructions (Signed)

## 2012-10-14 NOTE — Progress Notes (Signed)
  Subjective:    Patient ID: Phillip Schultz, male    DOB: Feb 06, 1976, 37 y.o.   MRN: [id number]  HPI Presents today with cough, sore throat, and sinus pressure, with onset three days ago. Denies taking OTC medications.     Review of Systems  Constitutional: Positive for chills. Negative for fever.  HENT: Positive for congestion, sore throat, sneezing, postnasal drip and sinus pressure. Negative for neck pain and neck stiffness.   Respiratory: Positive for cough. Negative for chest tightness, shortness of breath and wheezing.   Cardiovascular: Negative for chest pain and palpitations.       Objective:   Physical Exam  Constitutional: He is oriented to person, place, and time. He appears well-developed and well-nourished.  HENT:  Head: Normocephalic and atraumatic.  Right Ear: External ear normal.  Left Ear: External ear normal.  Nose: Mucosal edema and rhinorrhea present.  Mouth/Throat: Posterior oropharyngeal erythema present.  Glossy eyes and watering  Cardiovascular: Normal rate, regular rhythm and normal heart sounds.   Pulmonary/Chest: Effort normal and breath sounds normal. No respiratory distress. He exhibits no tenderness.  Neurological: He is alert and oriented to person, place, and time.  Skin: Skin is warm and dry.  Psychiatric: He has a normal mood and affect.          Assessment & Plan:  1. Acute upper respiratory infections of unspecified site - amoxicillin (AMOXIL) 500 MG capsule; Take 1 capsule (500 mg total) by mouth 2 (two) times daily.  Dispense: 20 capsule; Refill: 0 -Increase fluid intake Motrin or tylenol OTC OTC decongestant Throat lozenges if help New toothbrush in 3 days Proper handwashing RTO if symptoms worsen or unresoleved Patient verbalized understanding Coralie Keens, FNP-C

## 2013-01-12 IMAGING — CT CT CERVICAL SPINE W/O CM
4 series · 15 of 33 positions shown, 18 images · non-contrast
Comparison: None.

CLINICAL DATA: Neck pain.  No known injury.

CT CERVICAL SPINE WITHOUT CONTRAST
TECHNIQUE: Multidetector CT imaging of the cervical spine was
performed. Multiplanar CT image reconstructions were also
generated.

[Series 3: cervical 2.0 st axial · axial · 0.32mm/px · z∈[+85,+213]mm · 5 of 97 slices shown, 7 images]
[im 17/97  soft-tissue]
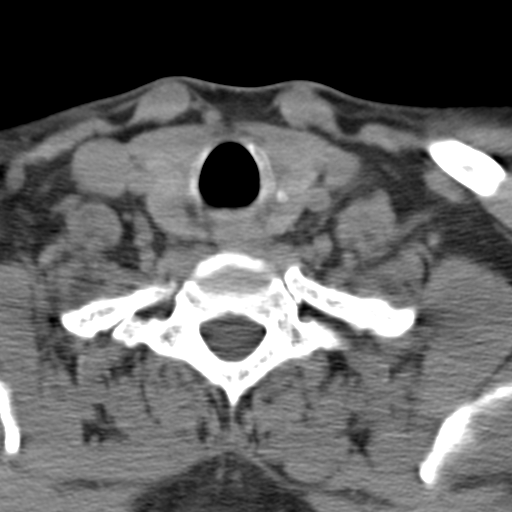
[im 17/97  bone]
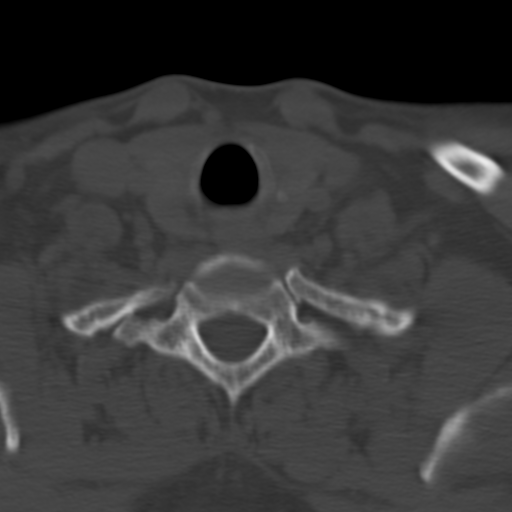
[im 33/97  bone]
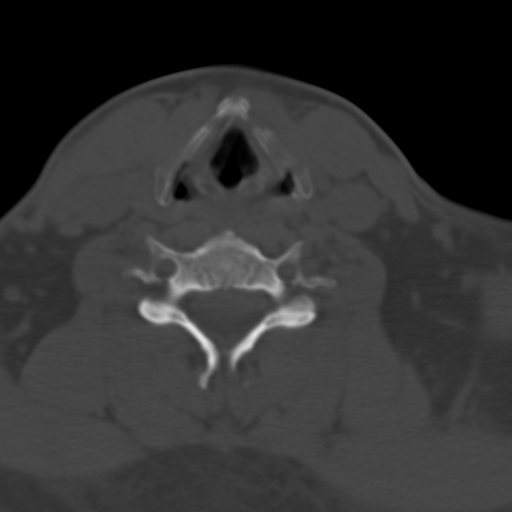
[im 49/97  bone]
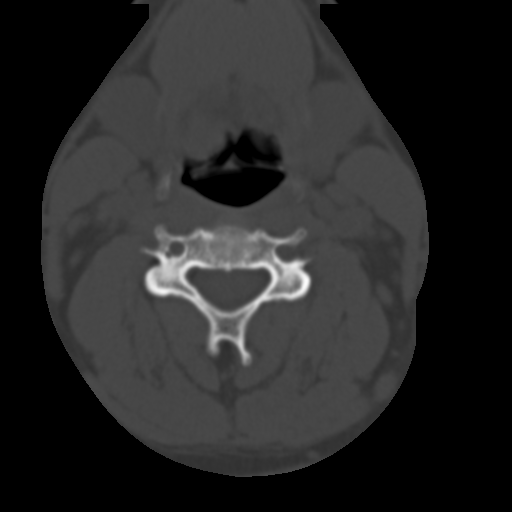
[im 65/97  bone]
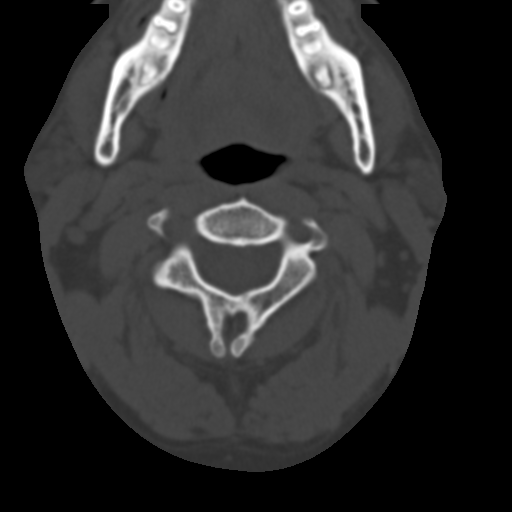
[im 81/97  soft-tissue]
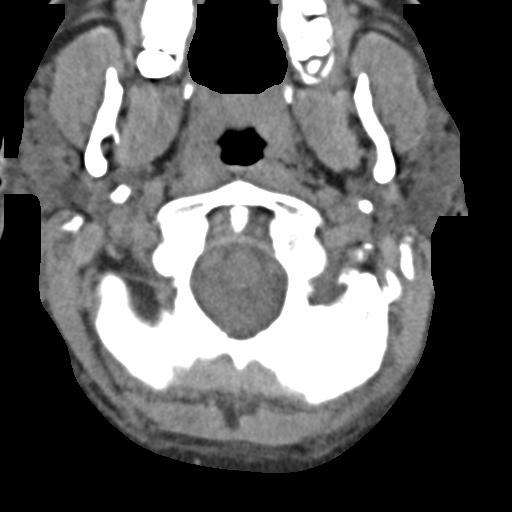
[im 81/97  bone]
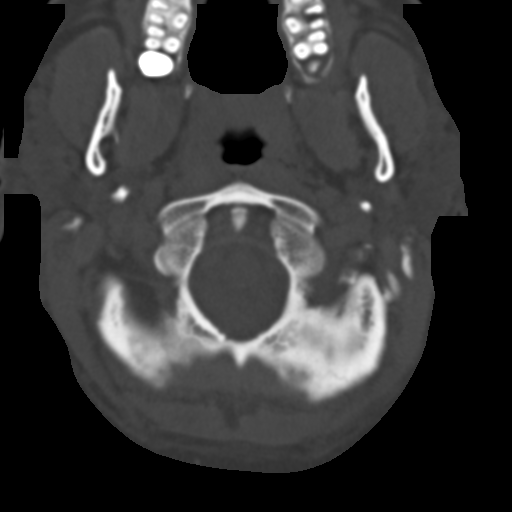

[Series 5: cervical spine sagittal bone · sagittal · 0.33mm/px · 5 of 51 slices shown, 6 images]
[im 17/51  bone]
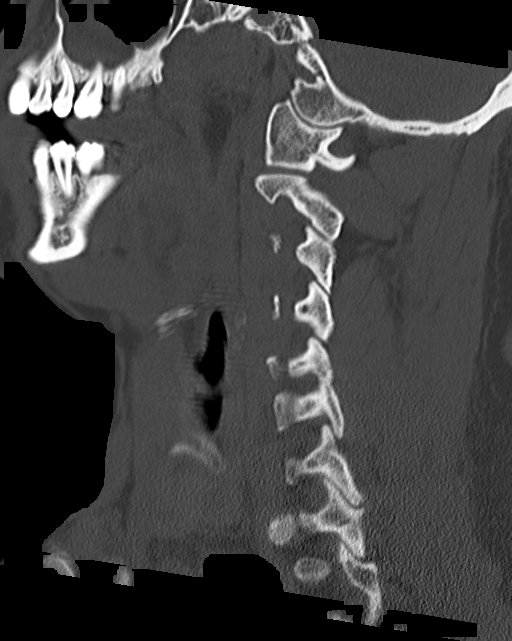
[im 21/51  bone]
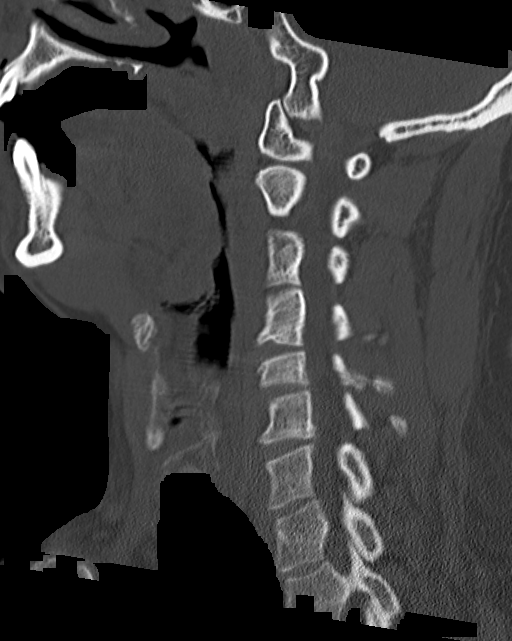
[im 26/51  soft-tissue]
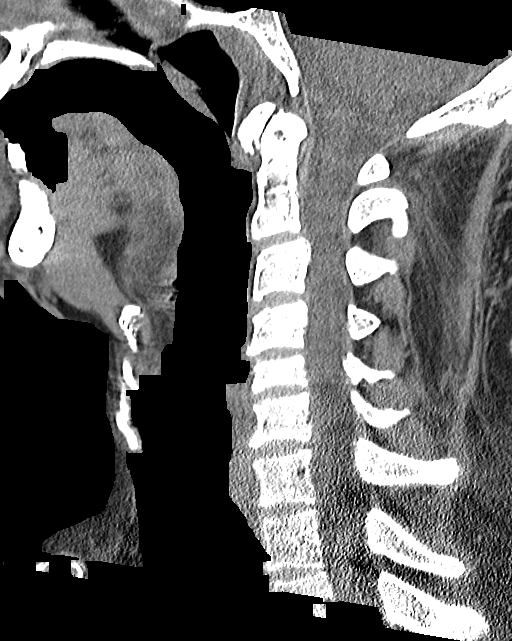
[im 26/51  bone]
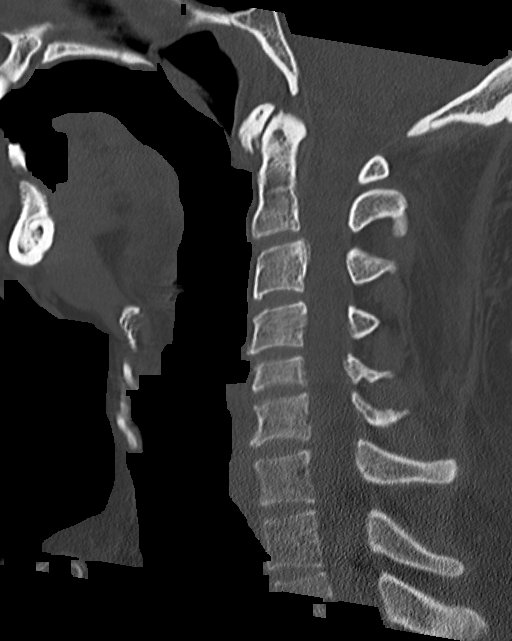
[im 30/51  bone]
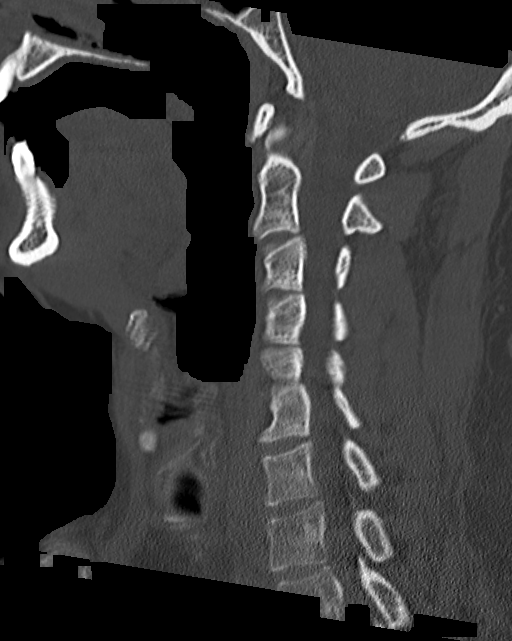
[im 34/51  bone]
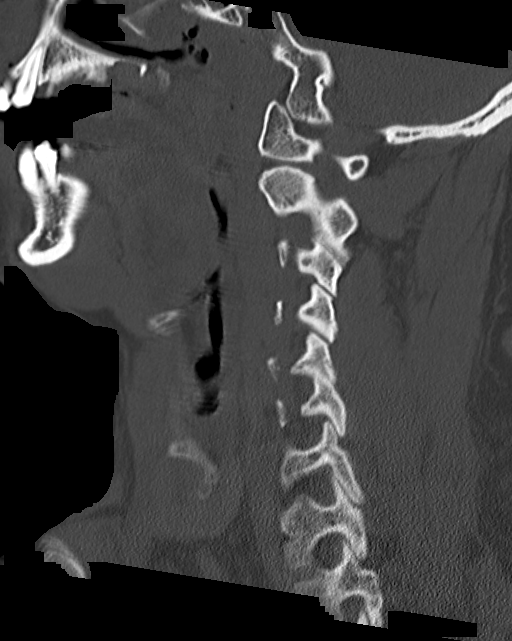

[Series 6: cervical spine coronal bone · coronal · 0.35mm/px · 3 of 73 slices shown]
[im 15/73  bone]
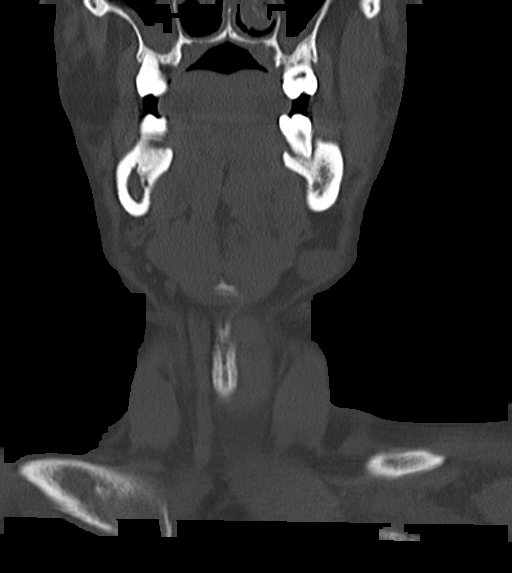
[im 29/73  bone]
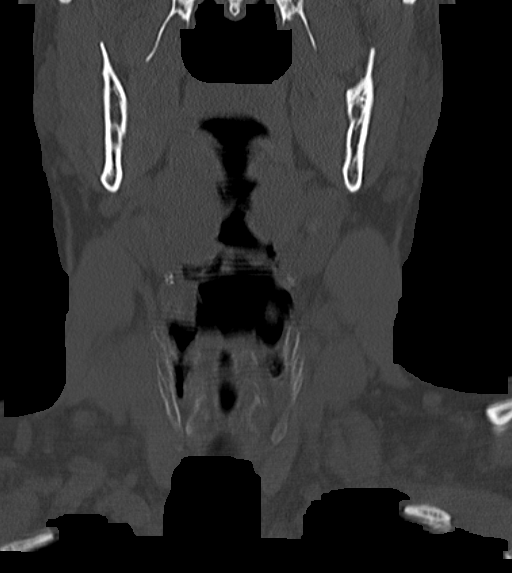
[im 44/73  bone]
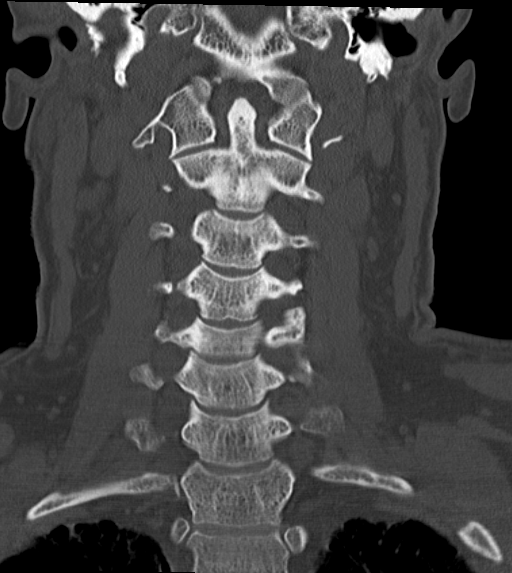

[Series 7: cervical spine axial bone · axial · 0.26mm/px · z∈[+70,+101]mm · 2 of 98 slices shown]
[im 17/98  bone]
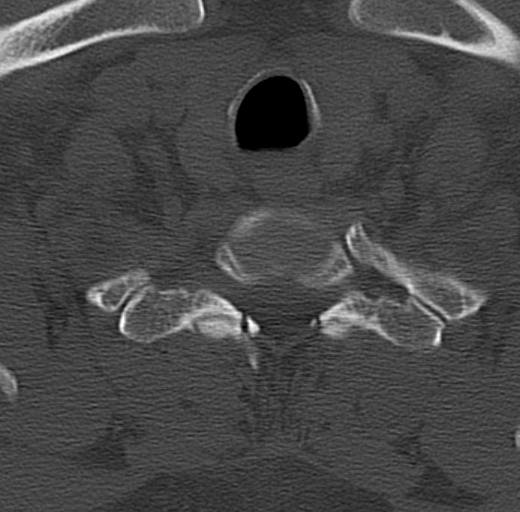
[im 33/98  bone]
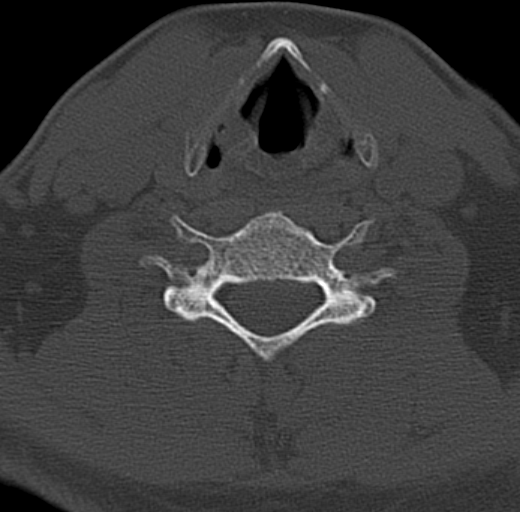

[15 of 33 positions shown; findings below may reference images not displayed]

FINDINGS: No fracture or subluxation is identified.  The C5
vertebral body appears hypoplastic.  Intervertebral disc space
height is maintained.  There is straightening of the normal
cervical lordosis.  Paraspinous soft tissue structures are
unremarkable.  Lung apices demonstrate emphysematous disease.
IMPRESSION: 1.  No acute finding.  Diminutive appearance of C5 is likely
congenital or less likely due to old trauma.
2.  Emphysema.

## 2015-04-17 ENCOUNTER — Encounter: Payer: Self-pay | Admitting: *Deleted

## 2015-04-18 ENCOUNTER — Ambulatory Visit (INDEPENDENT_AMBULATORY_CARE_PROVIDER_SITE_OTHER): Payer: BLUE CROSS/BLUE SHIELD | Admitting: Neurology

## 2015-04-18 ENCOUNTER — Encounter: Payer: Self-pay | Admitting: Neurology

## 2015-04-18 VITALS — BP 146/94 | HR 103 | Ht 72.0 in | Wt 241.2 lb

## 2015-04-18 DIAGNOSIS — R569 Unspecified convulsions: Secondary | ICD-10-CM | POA: Insufficient documentation

## 2015-04-18 NOTE — Patient Instructions (Signed)
Overall you are doing well but I do want to suggest a few things today:   Remember to drink plenty of fluid, eat healthy meals and do not skip any meals. Try to eat protein with a every meal and eat a healthy snack such as fruit or nuts in between meals. Try to keep a regular sleep-wake schedule and try to exercise daily, particularly in the form of walking, 20-30 minutes a day, if you can.   As far as your medications are concerned, I would like to suggest: none at this time  As far as diagnostic testing: MRi of the brain, EEG, Labs  I would like to see you back after workup, sooner if we need to. Please call us with any interim questions, concerns, problems, updates or refill requests.   Our phone number is [phone]. We also have an after hours call service for urgent matters and there is a physician on-call for urgent questions. For any emergencies you know to call 911 or go to the nearest emergency room

## 2015-04-18 NOTE — Progress Notes (Signed)
[facility]    Provider:  Dr Lucia Gaskins Referring Provider: Ileana Ladd, MD Primary Care Physician:  Redmond Baseman, MD  CC:  seizure  HPI:  Phillip Schultz is a 39 y.o. male here as a referral from Dr. Modesto Charon for seizures. He had been sick and he had a bad headache, he laid down to go to bed. He got up and girlfriend heard a thump. He did n't answer. He was laying beside the bed shaking. He doesn't remember it. He hit the night stand, bruised his lip. Lasted a few minutes. Bit his tongue on both sides. He hadn't been sleeping, sore throat, possibly a fever he was having chills and sweats. He was confused afterwards. He fell asleep snoring then he "came back to". The sheriff showed up and by then he was feeling better, headache was gone, not sweating. No drug use at all. It has been a few years. No pain on urination. He has never had a seizure before. No family history of seizure. No seizures as a child. He drinks once a week, a pint of liquor, it had been a week since having any liquor. He denies withdrawal symptoms. Seizure was 2 weeks ago today. He has been fine since then. Smokes 2 packs a day.   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 Total CK was 1731 on admission and 1347 at the time of discharge, Tachycardia secondary to dehydration, Abnormal musculoskeletal movements secondary to cocaine, Leukocytosis secondary to cocaine intoxication and secondarily to mild urinary tract infection. WBC was 26 on admission and 11.2 at the time of discharge. Metabolic acidosis secondary to cocaine and alcohol, resolved, History of hypertension, Mild hyperglycemia, Hemoglobin A1c within normal limits at 5.7, Radiographic findings of emphysema.  Review of Systems: Patient complains of  symptoms per HPI as well as the following symptoms:snoring, restless legs  perrtinent negatives per HPI. All others negative.   Social History   Social History  . Marital Status: Single    Spouse Name: N/A  . Number of Children: 0  . Years of Education: 12   Occupational History  . [employer]    Social History Main Topics  . Smoking status: Current Every Day Smoker -- 2.00 packs/day    Types: Cigarettes    Start date: 08/25/1992  . Smokeless tobacco: Not on file  . Alcohol Use: 0.0 oz/week    0 Standard drinks or equivalent per week  . Drug Use: Yes    Special: Cocaine, Methamphetamines  . Sexual Activity: Not on file   Other Topics Concern  . Not on file   Social History Narrative   Lives with girlfriend    Caffeine use: Drinks 2 energy drinks per day    Family History  Problem Relation Age of Onset  . Diabetes Mother   . Seizures Neg Hx     Past Medical History  Diagnosis Date  . Hypertension   . History of depression     Attempted suicide as a teenager.  . Cocaine abuse   . Tobacco abuse   . Alcohol consumption binge drinking   . 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 Emphysema     Seen radiographically.    Past Surgical History  Procedure Laterality Date  . Tonsillectomy      Current Outpatient Prescriptions  Medication Sig Dispense Refill  . amoxicillin (AMOXIL) 500 MG capsule Take 1 capsule (500 mg total) by mouth 2 (two) times daily. 20 capsule 0  . Celecoxib (CELEBREX PO) Take by mouth.    . cloNIDine (CATAPRES) 0.1 MG tablet Take 1 tablet (0.1 mg total) by mouth at bedtime. 30 tablet 2   No current facility-administered medications for this visit.    Allergies as of 04/18/2015  . (No Known Allergies)    Vitals: BP 146/94 mmHg  Pulse 103  Ht 6' (1.829 m)  Wt 241 lb 3.2 oz (109.408 kg)  BMI 32.71 kg/m2 Last Weight:  Wt Readings from Last 1 Encounters:  04/18/15 241 lb 3.2 oz (109.408 kg)   Last Height:     Ht Readings from Last 1 Encounters:  04/18/15 6' (1.829 m)   Physical exam: Exam: Gen: NAD, conversant, well nourised, obese, well groomed                     CV: RRR, no MRG. No Carotid Bruits. No peripheral edema, warm, nontender Eyes: Conjunctivae clear without exudates or hemorrhage  Neuro: Detailed Neurologic Exam  Speech:    Speech is normal; fluent and spontaneous with normal comprehension.  Cognition:    The patient is oriented to person, place, and time;     recent and remote memory intact;     language fluent;     normal attention, concentration,     fund of knowledge Cranial Nerves:    The pupils are equal, round, and reactive to light. The fundi are flat. Visual fields are full to finger confrontation. Extraocular movements are intact. Trigeminal sensation is intact and the muscles of mastication are normal. The face is symmetric. The palate elevates in the midline. Hearing intact. Voice is normal. Shoulder shrug is normal. The tongue has normal motion without fasciculations.   Coordination:    Normal finger to nose and heel to shin. Normal rapid alternating movements.   Gait:    Heel-toe and tandem gait are normal.   Motor Observation:    No asymmetry, no atrophy, and no involuntary movements noted. Tone:    Normal muscle tone.    Posture:    Posture is normal. normal erect    Strength:    Strength is V/V in the upper and lower limbs.      Sensation: intact to LT     Reflex Exam:  DTR's:    Deep tendon reflexes in the upper and lower extremities are normal bilaterally.   Toes:    The toes are equivocal bilaterally.   Clonus:    Clonus is absent.       Assessment/Plan:  39 year old with PMHx of polysubstance abuse, tobacco abuse, HTN here for a witnessed GTCS.   MRi of the brain Labs, urine EEG May consider 3-day eeg if all negative No AEDs at this time.  Patient is unable to drive, operate heavy machinery, perform activities at heights or  participate in water activities until 6 months seizure free  CC: Dr. Timmie FoersterWong   Alaster Asfaw, MD  [facility] [address] [place], [place] [id number]  Phone [phone] Fax [phone]

## 2015-04-25 LAB — COMPREHENSIVE METABOLIC PANEL
A/G RATIO: 2.1 (ref 1.1–2.5)
ALK PHOS: 74 IU/L (ref 39–117)
ALT: 38 IU/L (ref 0–44)
AST: 24 IU/L (ref 0–40)
Albumin: 4.7 g/dL (ref 3.5–5.5)
BILIRUBIN TOTAL: 0.4 mg/dL (ref 0.0–1.2)
BUN/Creatinine Ratio: 19 (ref 9–20)
BUN: 15 mg/dL (ref 6–24)
CO2: 22 mmol/L (ref 18–29)
Calcium: 9.6 mg/dL (ref 8.7–10.2)
Chloride: 100 mmol/L (ref 96–106)
Creatinine, Ser: 0.78 mg/dL (ref 0.76–1.27)
GFR calc Af Amer: 130 mL/min/{1.73_m2} (ref >59)
GFR calc non Af Amer: 112 mL/min/{1.73_m2} (ref >59)
GLOBULIN, TOTAL: 2.2 g/dL (ref 1.5–4.5)
Glucose: 103 mg/dL — ABNORMAL HIGH (ref 65–99)
POTASSIUM: 4.4 mmol/L (ref 3.5–5.2)
SODIUM: 138 mmol/L (ref 134–144)
Total Protein: 6.9 g/dL (ref 6.0–8.5)

## 2015-04-25 LAB — CBC
Hematocrit: 44.7 % (ref 37.5–51.0)
Hemoglobin: 15.4 g/dL (ref 12.6–17.7)
MCH: 30.1 pg (ref 26.6–33.0)
MCHC: 34.5 g/dL (ref 31.5–35.7)
MCV: 88 fL (ref 79–97)
PLATELETS: 340 10*3/uL (ref 150–379)
RBC: 5.11 x10E6/uL (ref 4.14–5.80)
RDW: 12.9 % (ref 12.3–15.4)
WBC: 12.7 10*3/uL — AB (ref 3.4–10.8)

## 2015-04-25 LAB — TOXASSURE SELECT 13 (MW), URINE: PDF: 0

## 2015-04-30 ENCOUNTER — Telehealth: Payer: Self-pay | Admitting: *Deleted

## 2015-04-30 NOTE — Telephone Encounter (Signed)
LMVM for pt to return call for lab results.  

## 2015-04-30 NOTE — Telephone Encounter (Signed)
-----   Message from Anson FretAntonia B Ahern, MD sent at 04/27/2015 11:31 AM EST ----- Labs normal except her has persistently elevated WBCs. They were elvated during this blood test as well as 3 years ago. He should see his primary care regarding this and be worked up for conditions that can cause this. thanks

## 2015-05-06 NOTE — Telephone Encounter (Signed)
LVM for pt to call about results. Gave [facility] phone number and hours.  

## 2015-05-14 ENCOUNTER — Ambulatory Visit (INDEPENDENT_AMBULATORY_CARE_PROVIDER_SITE_OTHER): Payer: BLUE CROSS/BLUE SHIELD

## 2015-05-14 ENCOUNTER — Encounter (INDEPENDENT_AMBULATORY_CARE_PROVIDER_SITE_OTHER): Payer: Self-pay

## 2015-05-14 DIAGNOSIS — R569 Unspecified convulsions: Secondary | ICD-10-CM | POA: Diagnosis not present

## 2015-05-14 MED ORDER — GADOPENTETATE DIMEGLUMINE 469.01 MG/ML IV SOLN: 20.0000 mL | Freq: Once | INTRAVENOUS | Status: AC | PRN

## 2015-05-14 NOTE — Telephone Encounter (Signed)
Patient here today for MRI. Requesting results of recent lab tests. 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 Best time to call is early in the morning around 8 am. Best number [phone]

## 2015-05-15 NOTE — Telephone Encounter (Signed)
Spoke to pt and relayted the results of labs.  He stated went to pcp yesterday and they redid lab work.

## 2015-05-16 ENCOUNTER — Telehealth: Payer: Self-pay

## 2015-05-16 NOTE — Telephone Encounter (Signed)
-----   Message from Anson FretAntonia B Ahern, MD sent at 05/16/2015 12:53 PM EST ----- MRI of the brain is normal thanks

## 2015-05-16 NOTE — Telephone Encounter (Signed)
Called pt to discuss MRI results (normal), no answer, left a message asking that he call our office back.

## 2015-05-16 NOTE — Progress Notes (Signed)
LVM to call back for results.

## 2015-05-21 NOTE — Telephone Encounter (Signed)
Spoke to pt about normal MRI brain per Dr Lucia Gaskins. Pt verbalized understanding.

## 2015-05-23 ENCOUNTER — Other Ambulatory Visit: Payer: BLUE CROSS/BLUE SHIELD

## 2015-05-23 ENCOUNTER — Encounter: Payer: Self-pay | Admitting: Neurology

## 2022-07-21 ENCOUNTER — Ambulatory Visit (INDEPENDENT_AMBULATORY_CARE_PROVIDER_SITE_OTHER): Payer: 59 | Admitting: Orthopaedic Surgery

## 2022-07-21 ENCOUNTER — Encounter: Payer: Self-pay | Admitting: Orthopaedic Surgery

## 2022-07-21 DIAGNOSIS — M1612 Unilateral primary osteoarthritis, left hip: Secondary | ICD-10-CM

## 2022-07-21 NOTE — Progress Notes (Signed)
Office Visit Note   Patient: Phillip Schultz           Date of Birth: 06/03/1975           MRN: [id number] Visit Date: 07/21/2022              Requested by: Aletha Halim., PA-C [address]  [place] [address] PCP: Aletha Halim., PA-C   Assessment & Plan: Visit Diagnoses:  1. Primary osteoarthritis of left hip     Plan: Impression is left hip osteoarthritis exacerbation.  He is improved quite a bit.  He will continue to take Celebrex.  Home exercises provided.  Work note was provided.  Will see him back as needed.  Follow-Up Instructions: No follow-ups on file.   Orders:  No orders of the defined types were placed in this encounter.  No orders of the defined types were placed in this encounter.     Procedures: No procedures performed   Clinical Data: No additional findings.   Subjective: Chief Complaint  Patient presents with   Left Hip - Pain    HPI  Phillip Schultz is a very pleasant 47 year old gentleman who experienced acute onset of left hip and groin pain 2 weeks ago at work and had immediate pain.  He could not walk for short period of time.  Originally had x-rays at outside hospital which showed moderate arthritis.  Overall his symptoms have improved and wants to return back to work but needs release note.  Review of Systems  Constitutional: Negative.   HENT: Negative.    Eyes: Negative.   Respiratory: Negative.    Cardiovascular: Negative.   Gastrointestinal: Negative.   Endocrine: Negative.   Genitourinary: Negative.   Skin: Negative.   Allergic/Immunologic: Negative.   Neurological: Negative.   Hematological: Negative.   Psychiatric/Behavioral: Negative.    All other systems reviewed and are negative.    Objective: Vital Signs: There were no vitals taken for this visit.  Physical Exam Vitals and nursing note reviewed.  Constitutional:      Appearance: He is well-developed.  HENT:     Head: Normocephalic and atraumatic.   Eyes:     Pupils: Pupils are equal, round, and reactive to light.  Pulmonary:     Effort: Pulmonary effort is normal.  Abdominal:     Palpations: Abdomen is soft.  Musculoskeletal:        General: Normal range of motion.     Cervical back: Neck supple.  Skin:    General: Skin is warm.  Neurological:     Mental Status: He is alert and oriented to person, place, and time.  Psychiatric:        Behavior: Behavior normal.        Thought Content: Thought content normal.        Judgment: Judgment normal.     Ortho Exam  Examination of the left hip shows preserved range of motion.  He has some mild groin pain with motion of the hip.  Specialty Comments:  No specialty comments available.  Imaging: No results found.   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   History of depression    Attempted suicide as a teenager.   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

## 2022-07-27 ENCOUNTER — Telehealth: Payer: Self-pay | Admitting: Orthopaedic Surgery

## 2022-07-27 NOTE — Telephone Encounter (Signed)
Patient called asked if he can get cortisone injections in both of his hips? The number to contact patient is [phone]

## 2022-07-27 NOTE — Telephone Encounter (Signed)
yes

## 2022-07-28 ENCOUNTER — Other Ambulatory Visit: Payer: Self-pay

## 2022-07-28 DIAGNOSIS — M1612 Unilateral primary osteoarthritis, left hip: Secondary | ICD-10-CM

## 2022-08-05 ENCOUNTER — Ambulatory Visit (INDEPENDENT_AMBULATORY_CARE_PROVIDER_SITE_OTHER): Payer: 59 | Admitting: Sports Medicine

## 2022-08-05 ENCOUNTER — Encounter: Payer: Self-pay | Admitting: Sports Medicine

## 2022-08-05 ENCOUNTER — Other Ambulatory Visit: Payer: Self-pay

## 2022-08-05 DIAGNOSIS — M1612 Unilateral primary osteoarthritis, left hip: Secondary | ICD-10-CM | POA: Diagnosis not present

## 2022-08-05 DIAGNOSIS — M25552 Pain in left hip: Secondary | ICD-10-CM | POA: Diagnosis not present

## 2022-08-05 DIAGNOSIS — M25551 Pain in right hip: Secondary | ICD-10-CM | POA: Diagnosis not present

## 2022-08-05 MED ORDER — LIDOCAINE HCL 1 % IJ SOLN
4.0000 mL | INTRAMUSCULAR | Status: AC | PRN
Start: 2022-08-05 — End: 2022-08-05
  Administered 2022-08-05: 4 mL

## 2022-08-05 MED ORDER — METHYLPREDNISOLONE ACETATE 40 MG/ML IJ SUSP
40.0000 mg | INTRAMUSCULAR | Status: AC | PRN
Start: 2022-08-05 — End: 2022-08-05
  Administered 2022-08-05: 40 mg via INTRA_ARTICULAR

## 2022-08-05 NOTE — Progress Notes (Addendum)
Office and Procedure Note  Patient: Phillip Schultz             Date of Birth: [birth year]           MRN: [id number]             Visit Date: 08/05/2022  Jimie is a pleasant 47 year old man who presents for bilateral hip pain, left greater than right.  Previously saw my partner, Dr. Erlinda Hong, and pain x-rays of the hip which showed left hip arthritis. He also states his right hip has been hurting him as well. Does take celebrex 100mg  for pain; Ibuprofen in PM.  Procedures: Visit Diagnoses:  1. Bilateral hip pain   2. Primary osteoarthritis of left hip     Large Joint Inj: L hip joint on 08/05/2022 9:25 AM Indications: pain Details: 22 G 3.5 in needle, ultrasound-guided anterior approach Medications: 4 mL lidocaine 1 %; 40 mg methylPREDNISolone acetate 40 MG/ML Outcome: tolerated well, no immediate complications  Procedure: US-guided intra-articular hip injection, left After discussion on risks/benefits/indications and informed verbal consent was obtained, a timeout was performed. Patient was lying supine on exam table. The hip was cleaned with betadine and alcohol swabs. Then utilizing ultrasound guidance, the patient's femoral head and neck junction was identified and subsequently injected with 4:1 lidocaine:depomedrol via an in-plane approach with ultrasound visualization of the injectate administered into the hip joint. Patient tolerated procedure well without immediate complications.  Procedure, treatment alternatives, risks and benefits explained, specific risks discussed. Consent was given by the patient. Immediately prior to procedure a time out was called to verify the correct patient, procedure, equipment, support staff and site/side marked as required. Patient was prepped and draped in the usual sterile fashion.    Large Joint Inj: R hip joint on 08/05/2022 9:26 AM Indications: pain Details: 22 G 3.5 in needle, ultrasound-guided anterior approach Medications: 4 mL lidocaine 1 %; 40  mg methylPREDNISolone acetate 40 MG/ML Outcome: tolerated well, no immediate complications  Procedure: US-guided intra-articular hip injection, right After discussion on risks/benefits/indications and informed verbal consent was obtained, a timeout was performed. Patient was lying supine on exam table. The hip was cleaned with betadine and alcohol swabs. Then utilizing ultrasound guidance, the patient's femoral head and neck junction was identified and subsequently injected with 4:1 lidocaine:depomedrol via an in-plane approach with ultrasound visualization of the injectate administered into the hip joint. Patient tolerated procedure well without immediate complications.  Procedure, treatment alternatives, risks and benefits explained, specific risks discussed. Consent was given by the patient. Immediately prior to procedure a time out was called to verify the correct patient, procedure, equipment, support staff and site/side marked as required. Patient was prepped and draped in the usual sterile fashion.     Plan:  - I evaluated the patient about 10 minutes post-injection and he had some improvement in pain and range of motion -Recommended ice as well as his Celebrex for any postinjection pain.  48 hours of activity modification.  Note was provided for work for today and tomorrow, may return on 08/07/2022 without restriction -Discussed with Delfino Lovett he may want to obtain specific x-ray imaging of the right hip as well - follow-up with Dr. Erlinda Hong as indicated - told him to give this at least 2 weeks to see full effect; I am happy to see them as needed  Elba Barman, DO [place]  This note was dictated using Dragon naturally speaking software and may contain  errors in syntax, spelling, or content which have not been identified prior to signing this note.
